# Patient Record
Sex: Female | Born: 1965 | Race: White | Marital: Single | Smoking: Never smoker
Health system: Southern US, Community
[De-identification: ages and names within clinical notes are randomized; demographics above are authoritative.]

## PROBLEM LIST (undated history)

## (undated) DIAGNOSIS — T7840XA Allergy, unspecified, initial encounter: Secondary | ICD-10-CM

## (undated) HISTORY — DX: Allergy, unspecified, initial encounter: T78.40XA

---

## 2015-11-08 ENCOUNTER — Other Ambulatory Visit: Payer: Self-pay | Admitting: Internal Medicine

## 2015-11-08 DIAGNOSIS — Z1231 Encounter for screening mammogram for malignant neoplasm of breast: Secondary | ICD-10-CM

## 2015-11-20 ENCOUNTER — Ambulatory Visit: Payer: Self-pay

## 2015-12-04 ENCOUNTER — Ambulatory Visit: Payer: Self-pay

## 2016-12-05 ENCOUNTER — Other Ambulatory Visit: Payer: Self-pay | Admitting: Internal Medicine

## 2016-12-05 DIAGNOSIS — Z1231 Encounter for screening mammogram for malignant neoplasm of breast: Secondary | ICD-10-CM

## 2016-12-09 ENCOUNTER — Ambulatory Visit: Payer: Self-pay

## 2017-01-06 ENCOUNTER — Encounter (HOSPITAL_COMMUNITY): Payer: Self-pay

## 2017-01-06 ENCOUNTER — Ambulatory Visit
Admission: RE | Admit: 2017-01-06 | Discharge: 2017-01-06 | Disposition: A | Discharge status: Home / Self Care | Payer: BLUE CROSS/BLUE SHIELD | Source: Ambulatory Visit | Attending: Internal Medicine | Admitting: Internal Medicine

## 2017-01-06 DIAGNOSIS — Z1231 Encounter for screening mammogram for malignant neoplasm of breast: Secondary | ICD-10-CM | POA: Diagnosis present

## 2017-11-17 ENCOUNTER — Other Ambulatory Visit: Payer: Self-pay | Admitting: Internal Medicine

## 2017-11-17 DIAGNOSIS — Z1231 Encounter for screening mammogram for malignant neoplasm of breast: Secondary | ICD-10-CM

## 2017-12-15 ENCOUNTER — Encounter (INDEPENDENT_AMBULATORY_CARE_PROVIDER_SITE_OTHER): Payer: Self-pay | Admitting: Vascular Surgery

## 2017-12-23 ENCOUNTER — Encounter (INDEPENDENT_AMBULATORY_CARE_PROVIDER_SITE_OTHER): Payer: Self-pay | Admitting: Vascular Surgery

## 2017-12-23 ENCOUNTER — Ambulatory Visit (INDEPENDENT_AMBULATORY_CARE_PROVIDER_SITE_OTHER): Payer: BLUE CROSS/BLUE SHIELD | Admitting: Vascular Surgery

## 2017-12-23 VITALS — BP 128/74 | HR 67 | Resp 14 | Ht <= 58 in | Wt 126.0 lb

## 2017-12-23 DIAGNOSIS — I83813 Varicose veins of bilateral lower extremities with pain: Secondary | ICD-10-CM | POA: Diagnosis not present

## 2017-12-23 NOTE — Progress Notes (Signed)
Subjective:    Patient ID: Monica SchaumannDimitra Phillipson, female    DOB: 08-02-1966, 52 y.o.   MRN: [id number] Chief Complaint  Patient presents with  . New Patient (Initial Visit)    Varicose Veins   Presents as a new patient referred by Dr. Graciela HusbandsKlein for evaluation of varicose veins.  The patient endorses a long-standing history of painful varicose veins located to the bilateral lower extremity.  The patient notes that she stands a lot for work.  The patient notes soreness along her varicosities especially towards the end of the day.  Her discomfort also increases with sitting and standing for long periods of time.  The patient denies any surgery or trauma to the bilateral lower extremity.  The patient denies any DVT history to the bilateral lower extremity.  The patient notes that her symptoms have progressed to the point that she is unable to function on a daily basis.  At this time, the patient does not engage in conservative therapy including wearing medical grade 1 compression stockings or elevating her legs.  The patient denies any fever, nausea vomiting.   Review of Systems  Constitutional: Negative.   HENT: Negative.   Eyes: Negative.   Respiratory: Negative.   Cardiovascular:       Painful varicose veins  Gastrointestinal: Negative.   Endocrine: Negative.   Genitourinary: Negative.   Musculoskeletal: Negative.   Skin: Negative.   Allergic/Immunologic: Negative.   Neurological: Negative.   Hematological: Negative.   Psychiatric/Behavioral: Negative.       Objective:   Physical Exam  Constitutional: She is oriented to person, place, and time. She appears well-developed. No distress.  HENT:  Head: Normocephalic and atraumatic.  Eyes: Conjunctivae are normal. Pupils are equal, round, and reactive to light.  Neck: Normal range of motion.  Cardiovascular: Normal rate, regular rhythm, normal heart sounds and intact distal pulses.  Pulses:      Radial pulses are 2+ on the right side, and  2+ on the left side.       Dorsalis pedis pulses are 2+ on the right side, and 2+ on the left side.       Posterior tibial pulses are 2+ on the right side, and 2+ on the left side.  Pulmonary/Chest: Effort normal and breath sounds normal.  Musculoskeletal: She exhibits no edema (No bilateral lower extremity edema noted).  Neurological: She is alert and oriented to person, place, and time.  Skin: She is not diaphoretic.  Less than 1 cm diffuse varicosities noted to the bilateral lower extremity.  There is no stasis dermatitis, skin changes, cellulitis or ulceration noted to the bilateral lower extremity.  Psychiatric: She has a normal mood and affect. Her behavior is normal. Judgment and thought content normal.  Vitals reviewed.  BP 128/74 (BP Location: Right Arm)   Pulse 67   Resp 14   Ht 4\' 10"  (1.473 m)   Wt 126 lb (57.2 kg)   BMI 26.33 kg/m   Past Medical History:  Diagnosis Date  . Allergy    Social History   Socioeconomic History  . Marital status: Single    Spouse name: Not on file  . Number of children: Not on file  . Years of education: Not on file  . Highest education level: Not on file  Social Needs  . Financial resource strain: Not on file  . Food insecurity - worry: Not on file  . Food insecurity - inability: Not on file  . Transportation needs - medical:  Not on file  . Transportation needs - non-medical: Not on file  Occupational History  . Not on file  Tobacco Use  . Smoking status: Never Smoker  . Smokeless tobacco: Never Used  Substance and Sexual Activity  . Alcohol use: No    Frequency: Never  . Drug use: Not on file  . Sexual activity: Not on file  Other Topics Concern  . Not on file  Social History Narrative  . Not on file   No past surgical history on file.  Family History  Problem Relation Age of Onset  . Varicose Veins Mother   . Varicose Veins Cousin   . Breast cancer Neg Hx    Allergies  Allergen Reactions  . Amoxicillin      Other reaction(s): Unknown      Assessment & Plan:  Presents as a new patient referred by Dr. Graciela Husbands for evaluation of varicose veins.  The patient endorses a long-standing history of painful varicose veins located to the bilateral lower extremity.  The patient notes that she stands a lot for work.  The patient notes soreness along her varicosities especially towards the end of the day.  Her discomfort also increases with sitting and standing for long periods of time.  The patient denies any surgery or trauma to the bilateral lower extremity.  The patient denies any DVT history to the bilateral lower extremity.  The patient notes that her symptoms have progressed to the point that she is unable to function on a daily basis.  At this time, the patient does not engage in conservative therapy including wearing medical grade 1 compression stockings or elevating her legs.  The patient denies any fever, nausea vomiting.  1. Varicose veins of both lower extremities with pain - New The patient was encouraged to wear graduated compression stockings (20-30 mmHg) on a daily basis. The patient was instructed to begin wearing the stockings first thing in the morning and removing them in the evening. The patient was instructed specifically not to sleep in the stockings. Prescription given.  In addition, behavioral modification including elevation during the day will be initiated. Anti-inflammatories for pain. I will bring the patient back to undergo bilateral venous duplex to rule out any contributing reflux disease The patient will follow up in three months to asses conservative management.  Information on chronic venous insufficiency and compression stockings was given to the patient. The patient was instructed to call the office in the interim if any worsening edema or ulcerations to the legs, feet or toes occurs. The patient expresses their understanding.  - VAS [place] LOWER EXTREMITY VENOUS REFLUX;  Future  Current Outpatient Medications on File Prior to Visit  Medication Sig Dispense Refill  . fexofenadine (ALLEGRA) 180 MG tablet Take by mouth.    Marland Kitchen ibuprofen (ADVIL,MOTRIN) 200 MG tablet      No current facility-administered medications on file prior to visit.    There are no Patient Instructions on file for this visit. No Follow-up on file.  Brantly Kalman A Sebastian Dzik, PA-C

## 2018-01-07 ENCOUNTER — Ambulatory Visit
Admission: RE | Admit: 2018-01-07 | Discharge: 2018-01-07 | Disposition: A | Discharge status: Home / Self Care | Payer: BLUE CROSS/BLUE SHIELD | Source: Ambulatory Visit | Attending: Internal Medicine | Admitting: Internal Medicine

## 2018-01-07 ENCOUNTER — Other Ambulatory Visit: Payer: Self-pay | Admitting: Internal Medicine

## 2018-01-07 DIAGNOSIS — Z1231 Encounter for screening mammogram for malignant neoplasm of breast: Secondary | ICD-10-CM

## 2018-03-30 ENCOUNTER — Ambulatory Visit (INDEPENDENT_AMBULATORY_CARE_PROVIDER_SITE_OTHER): Payer: BLUE CROSS/BLUE SHIELD | Admitting: Vascular Surgery

## 2018-03-30 ENCOUNTER — Encounter (INDEPENDENT_AMBULATORY_CARE_PROVIDER_SITE_OTHER): Payer: BLUE CROSS/BLUE SHIELD

## 2018-07-20 ENCOUNTER — Encounter

## 2018-07-20 ENCOUNTER — Ambulatory Visit (INDEPENDENT_AMBULATORY_CARE_PROVIDER_SITE_OTHER): Payer: BLUE CROSS/BLUE SHIELD | Admitting: Vascular Surgery

## 2018-07-20 ENCOUNTER — Encounter (INDEPENDENT_AMBULATORY_CARE_PROVIDER_SITE_OTHER): Payer: BLUE CROSS/BLUE SHIELD

## 2018-08-10 ENCOUNTER — Encounter (INDEPENDENT_AMBULATORY_CARE_PROVIDER_SITE_OTHER): Payer: BLUE CROSS/BLUE SHIELD

## 2018-08-10 ENCOUNTER — Ambulatory Visit (INDEPENDENT_AMBULATORY_CARE_PROVIDER_SITE_OTHER): Payer: BLUE CROSS/BLUE SHIELD | Admitting: Vascular Surgery

## 2018-08-11 ENCOUNTER — Encounter (INDEPENDENT_AMBULATORY_CARE_PROVIDER_SITE_OTHER): Payer: Self-pay | Admitting: Nurse Practitioner

## 2018-08-11 ENCOUNTER — Ambulatory Visit (INDEPENDENT_AMBULATORY_CARE_PROVIDER_SITE_OTHER): Payer: BLUE CROSS/BLUE SHIELD | Admitting: Nurse Practitioner

## 2018-08-11 ENCOUNTER — Ambulatory Visit (INDEPENDENT_AMBULATORY_CARE_PROVIDER_SITE_OTHER): Payer: BLUE CROSS/BLUE SHIELD

## 2018-08-11 VITALS — BP 124/71 | HR 68 | Resp 15 | Ht <= 58 in | Wt 121.0 lb

## 2018-08-11 DIAGNOSIS — I83813 Varicose veins of bilateral lower extremities with pain: Secondary | ICD-10-CM | POA: Diagnosis not present

## 2018-08-11 DIAGNOSIS — I872 Venous insufficiency (chronic) (peripheral): Secondary | ICD-10-CM

## 2018-08-11 DIAGNOSIS — L508 Other urticaria: Secondary | ICD-10-CM | POA: Insufficient documentation

## 2018-08-11 NOTE — Progress Notes (Signed)
Subjective:    Patient ID: Monica Weaver, female    DOB: [birth year], 52 y.o.   MRN: [id number] Chief Complaint  Patient presents with  . Follow-up    ultrasound follow up    HPI  Monica Weaver is a 52 y.o. female that is following up for lower extremity swelling and bilateral varicosities.  She states that she has spider varicosities bilaterally and states that they are increasing which is been a concern for her.  Since her last visit, she has been wearing medical grade 1 compression stockings bilaterally.  She states that they have helped her with the heaviness feeling in her bilateral lower extremities.  She states that she has had less swelling since she has begun to wear them.  There is been no change in her spider varicosities.  She has no prior surgical history.  Her only other medical history is periodic urticaria.  She denies any rest pain or claudication-like symptoms.  She denies any chest pain or shortness of breath.  She denies any fever, chills, nausea, vomiting.  She underwent a bilateral lower extremity venous reflux study today which revealed no evidence of DVT or superficial venous thrombosis.  She has reflux greater than 500 ms in her left common femoral vein.  Past Medical History:  Diagnosis Date  . Allergy     History reviewed. No pertinent surgical history.  Social History   Socioeconomic History  . Marital status: Single    Spouse name: Not on file  . Number of children: Not on file  . Years of education: Not on file  . Highest education level: Not on file  Occupational History  . Not on file  Social Needs  . Financial resource strain: Not on file  . Food insecurity:    Worry: Not on file    Inability: Not on file  . Transportation needs:    Medical: Not on file    Non-medical: Not on file  Tobacco Use  . Smoking status: Never Smoker  . Smokeless tobacco: Never Used  Substance and Sexual Activity  . Alcohol use: No    Frequency: Never    . Drug use: Not on file  . Sexual activity: Not on file  Lifestyle  . Physical activity:    Days per week: Not on file    Minutes per session: Not on file  . Stress: Not on file  Relationships  . Social connections:    Talks on phone: Not on file    Gets together: Not on file    Attends religious service: Not on file    Active member of club or organization: Not on file    Attends meetings of clubs or organizations: Not on file    Relationship status: Not on file  . Intimate partner violence:    Fear of current or ex partner: Not on file    Emotionally abused: Not on file    Physically abused: Not on file    Forced sexual activity: Not on file  Other Topics Concern  . Not on file  Social History Narrative  . Not on file    Family History  Problem Relation Age of Onset  . Varicose Veins Mother   . Varicose Veins Cousin   . Breast cancer Neg Hx     Allergies  Allergen Reactions  . Amoxicillin     Other reaction(s): Unknown     Review of Systems   Review of Systems: Negative Unless Checked Constitutional: [] Weight  loss  [] Fever  [] Chills Cardiac: [] Chest pain   []  Atrial Fibrillation  [] Palpitations   [] Shortness of breath when laying flat   [] Shortness of breath with exertion. Vascular:  [] Pain in legs with walking   [] Pain in legs with standing  [] History of DVT   [] Phlebitis   [x] Swelling in legs   [x] Varicose veins   [] Non-healing ulcers Pulmonary:   [] Uses home oxygen   [] Productive cough   [] Hemoptysis   [] Wheeze  [] COPD   [] Asthma Neurologic:  [] Dizziness   [] Seizures   [] History of stroke   [] History of TIA  [] Aphasia   [] Vissual changes   [] Weakness or numbness in arm   [] Weakness or numbness in leg Musculoskeletal:   [] Joint swelling   [] Joint pain   [] Low back pain  []  History of Knee Replacement Hematologic:  [] Easy bruising  [] Easy bleeding   [] Hypercoagulable state   [] Anemic Gastrointestinal:  [] Diarrhea   [] Vomiting  [] Gastroesophageal reflux/heartburn    [] Difficulty swallowing. Genitourinary:  [] Chronic kidney disease   [] Difficult urination  [] Anuric   [] Blood in urine Skin:  [] Rashes   [] Ulcers  Psychological:  [] History of anxiety   []  History of major depression  []  Memory Difficulties     Objective:   Physical Exam  BP 124/71 (BP Location: Right Arm)   Pulse 68   Resp 15   Ht 4\' 10"  (1.473 m)   Wt 121 lb (54.9 kg)   BMI 25.29 kg/m   Gen: WD/WN, NAD Head: [place]/AT, No temporalis wasting.  Ear/Nose/Throat: Hearing grossly intact, nares w/o erythema or drainage Eyes: PER, EOMI, sclera nonicteric.  Neck: Supple, no masses.  No JVD.  Pulmonary:  Good air movement, no use of accessory muscles.  Cardiac: RRR Vascular: bilateral scattered spider varicosities Vessel Right Left  Radial Palpable Palpable  Dorsalis Pedis Palpable Palpable  Posterior Tibial Palpable Palpable   Gastrointestinal: soft, non-distended. No guarding/no peritoneal signs.  Musculoskeletal: M/S 5/5 throughout.  No deformity or atrophy.  Neurologic: Pain and light touch intact in extremities.  Symmetrical.  Speech is fluent. Motor exam as listed above. Psychiatric: Judgment intact, Mood & affect appropriate for pt's clinical situation. Dermatologic: No Venous rashes. No Ulcers Noted.  No changes consistent with cellulitis. Lymph : No Cervical lymphadenopathy, no lichenification or skin changes of chronic lymphedema.      Assessment & Plan:   1. Chronic venous insufficiency She underwent a bilateral lower extremity venous reflux study today which revealed no evidence of DVT or superficial venous thrombosis.  She has reflux greater than 500 ms in her left common femoral vein.  Recommend:  The patient is complaining of varicose veins.    I have had a long discussion with the patient regarding  varicose veins and why they cause symptoms.  Patient will begin wearing graduated compression stockings on a daily basis, beginning first thing in the morning and  removing them in the evening. The patient is instructed specifically not to sleep in the stockings.    The patient  will also begin using over-the-counter analgesics such as Motrin 600 mg po TID to help control the symptoms as needed.    In addition, behavioral modification including elevation during the day will be initiated, utilizing a recliner was recommended.  The patient is also instructed to continue exercising such as walking 4-5 times per week.  At this time the patient wishes to continue conservative therapy and is not interested in more invasive treatments such as laser ablation and sclerotherapy.  The  Patient will follow up PRN if the symptoms worsen.  2. Recurrent periodic urticaria Well controlled with allegra. Will continue to use.    Current Outpatient Medications on File Prior to Visit  Medication Sig Dispense Refill  . fexofenadine (ALLEGRA) 180 MG tablet Take by mouth.    Marland Kitchen ibuprofen (ADVIL,MOTRIN) 200 MG tablet      No current facility-administered medications on file prior to visit.     There are no Patient Instructions on file for this visit. Return if symptoms worsen or fail to improve.   Georgiana Spinner, NP  This note was completed with Office manager.  Any errors are purely unintentional.

## 2019-03-17 ENCOUNTER — Other Ambulatory Visit: Payer: Self-pay | Admitting: Obstetrics and Gynecology

## 2019-03-17 DIAGNOSIS — Z1231 Encounter for screening mammogram for malignant neoplasm of breast: Secondary | ICD-10-CM

## 2019-05-24 ENCOUNTER — Other Ambulatory Visit: Payer: Self-pay

## 2019-05-24 ENCOUNTER — Ambulatory Visit
Admission: RE | Admit: 2019-05-24 | Discharge: 2019-05-24 | Disposition: A | Discharge status: Home / Self Care | Payer: BLUE CROSS/BLUE SHIELD | Source: Ambulatory Visit | Attending: Obstetrics and Gynecology | Admitting: Obstetrics and Gynecology

## 2019-05-24 DIAGNOSIS — Z1231 Encounter for screening mammogram for malignant neoplasm of breast: Secondary | ICD-10-CM

## 2020-03-21 ENCOUNTER — Other Ambulatory Visit: Payer: Self-pay | Admitting: Obstetrics and Gynecology

## 2020-03-21 DIAGNOSIS — Z1231 Encounter for screening mammogram for malignant neoplasm of breast: Secondary | ICD-10-CM

## 2020-12-30 IMAGING — MG DIGITAL SCREENING BILATERAL MAMMOGRAM WITH TOMO AND CAD
6 of 10 series · 6 of 30 positions shown · non-contrast
Comparison: Previous exam(s).

CLINICAL DATA: Screening.

EXAM:
DIGITAL SCREENING BILATERAL MAMMOGRAM WITH TOMO AND CAD

[R CC synth-2D]
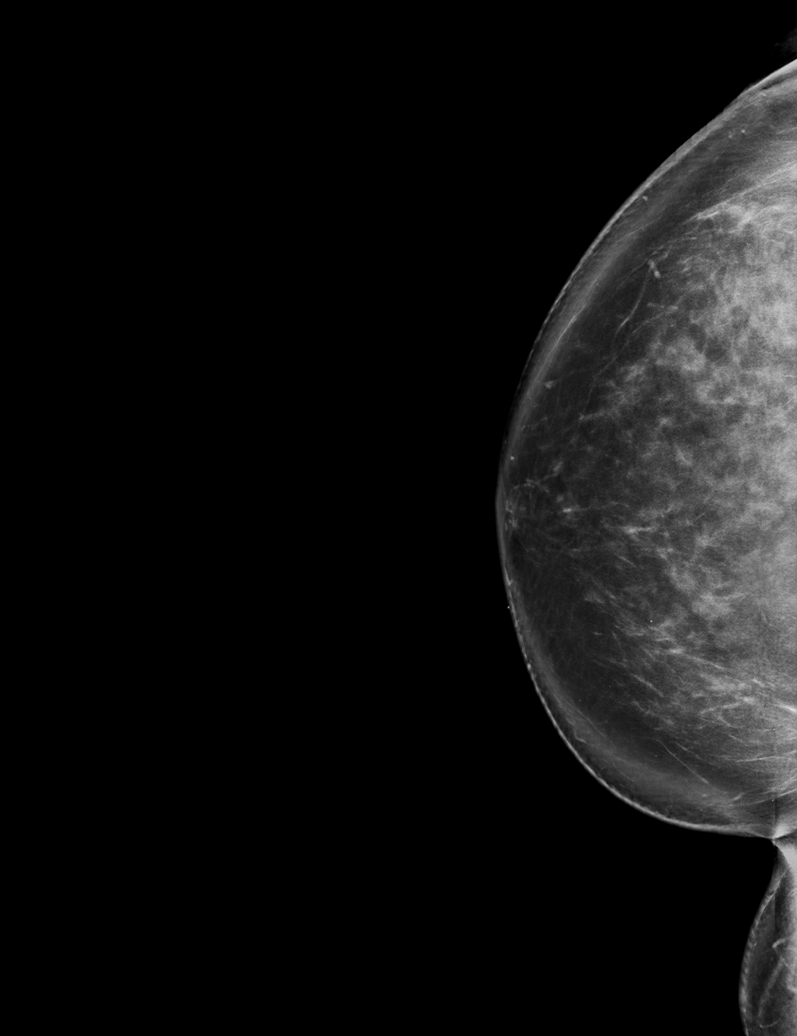

[L MLO synth-2D (1 of 2)]
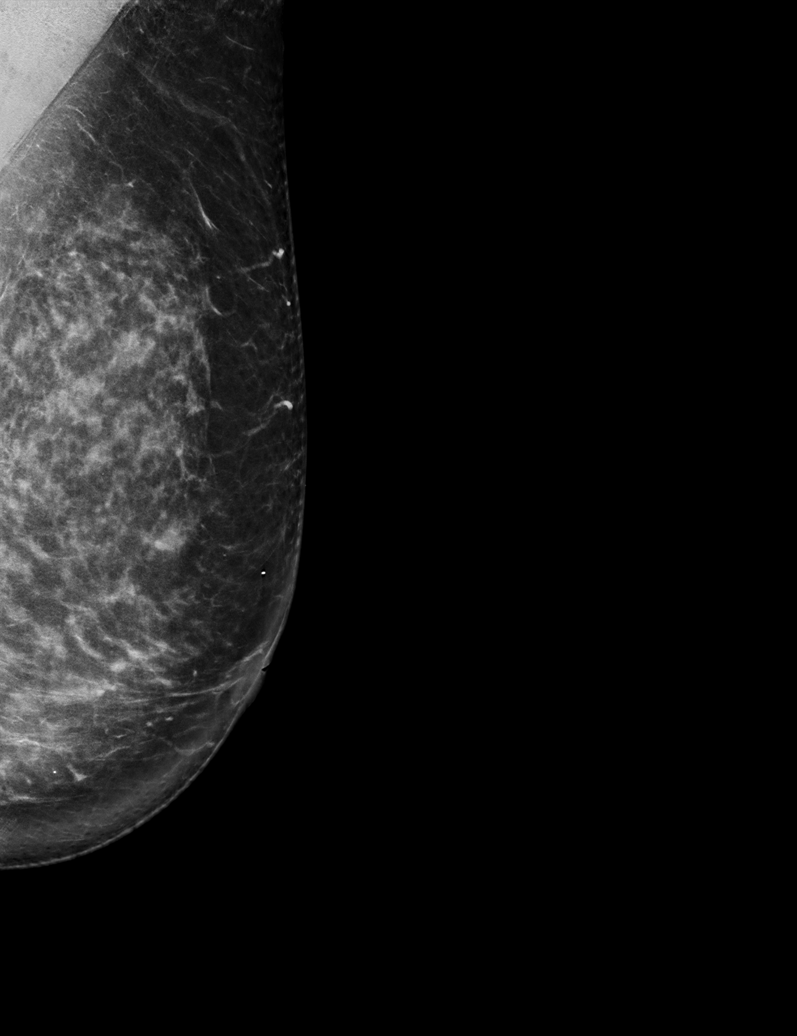

[R MLO synth-2D]
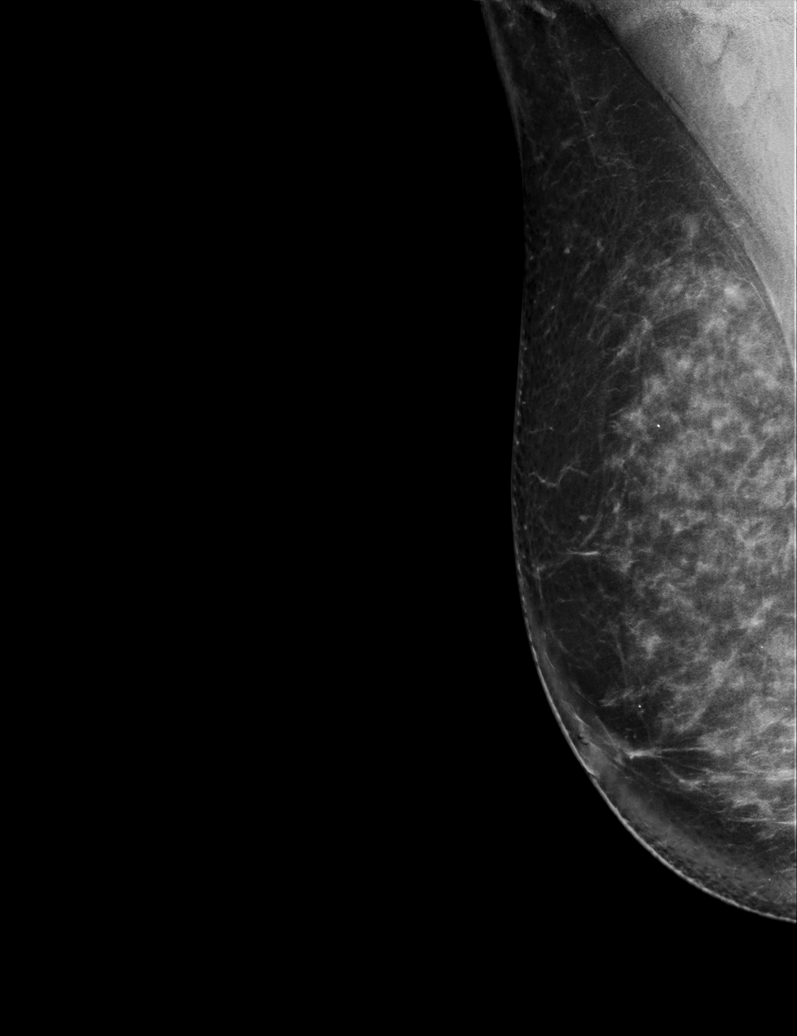

[L CC synth-2D]
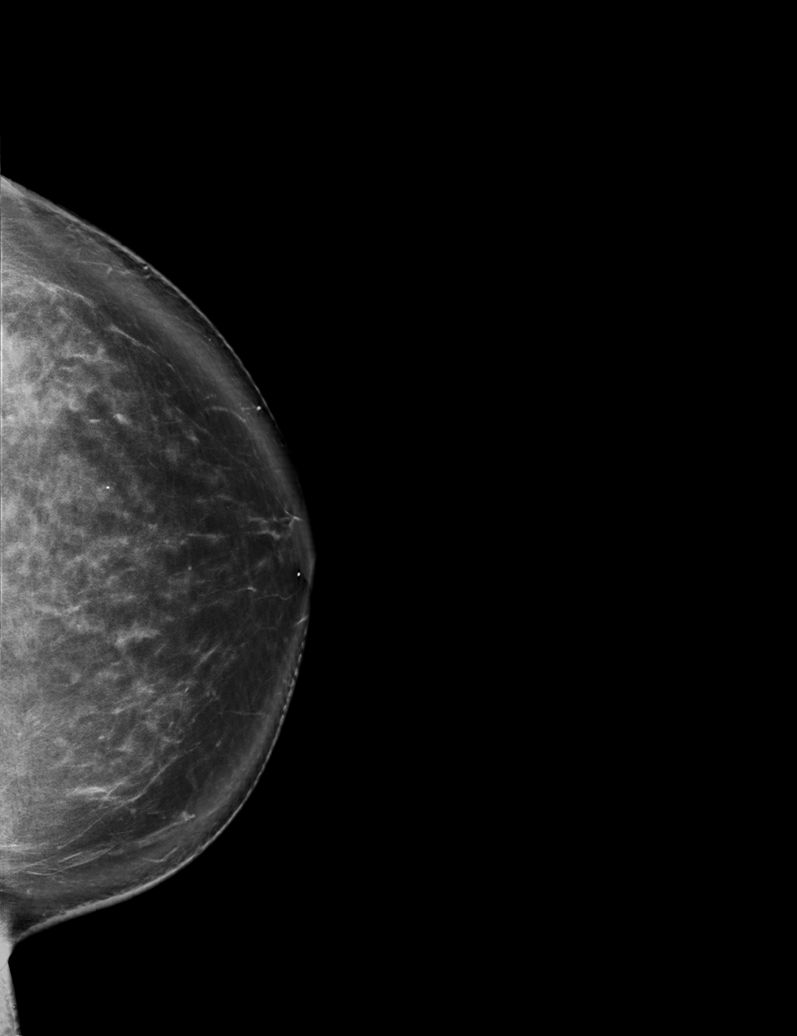

[L MLO synth-2D (2 of 2)]
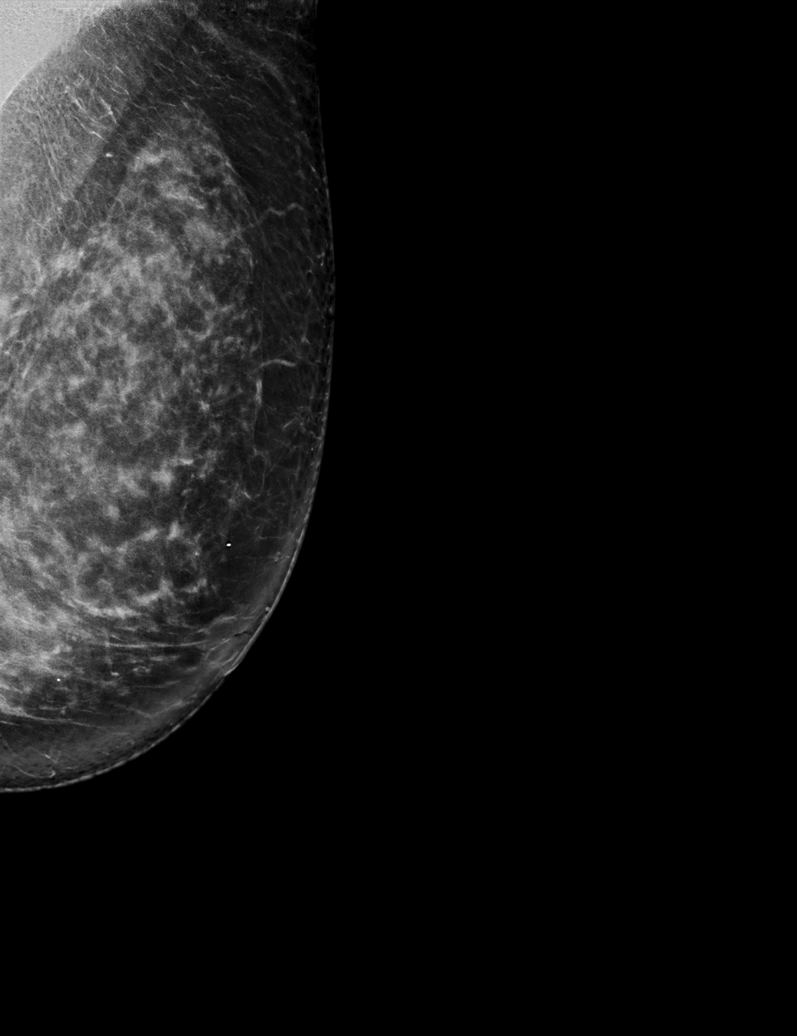

[R MLO tomo · tomo slice 47/93.0]
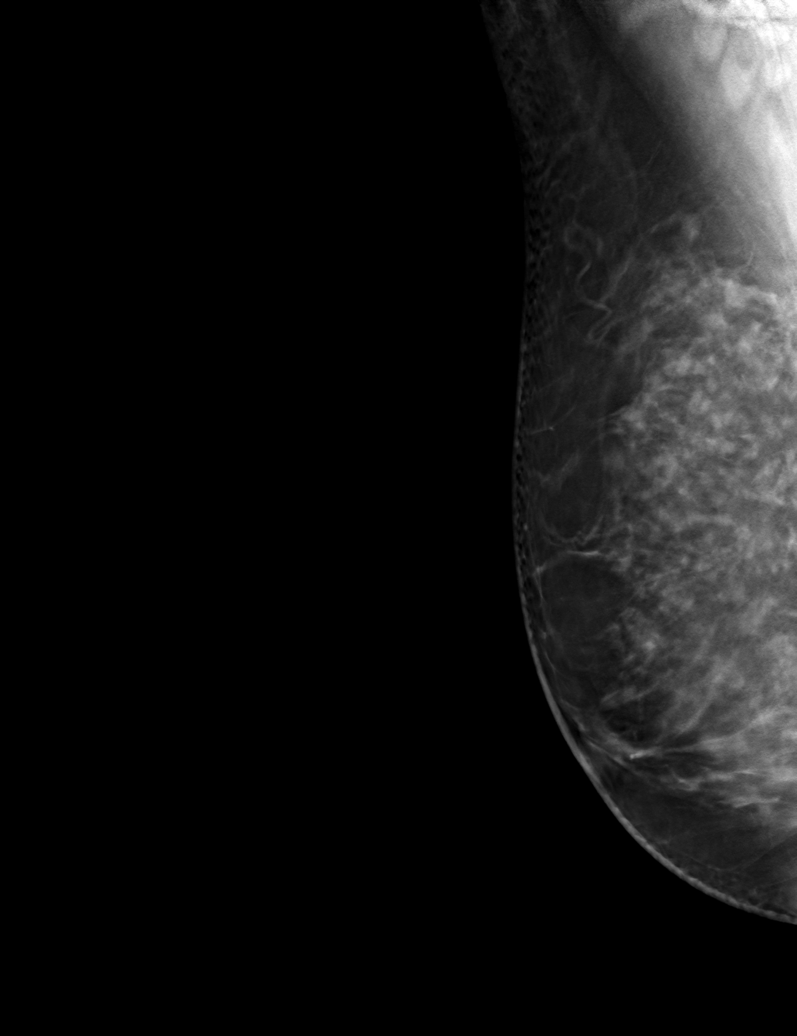

[6 of 30 positions shown; findings below may reference images not displayed]

ACR Breast Density Category c: The breast tissue is heterogeneously
dense, which may obscure small masses.
FINDINGS: There are no findings suspicious for malignancy. Images were
processed with CAD.
IMPRESSION: No mammographic evidence of malignancy. A result letter of this
screening mammogram will be mailed directly to the patient.

RECOMMENDATION:
Screening mammogram in one year. (Code:FT-U-LHB)

BI-RADS CATEGORY  1: Negative.

## 2022-04-01 DIAGNOSIS — E785 Hyperlipidemia, unspecified: Secondary | ICD-10-CM | POA: Diagnosis not present

## 2022-04-01 DIAGNOSIS — Z0000 Encounter for general adult medical examination without abnormal findings: Secondary | ICD-10-CM | POA: Diagnosis not present

## 2022-04-08 DIAGNOSIS — Z1211 Encounter for screening for malignant neoplasm of colon: Secondary | ICD-10-CM | POA: Diagnosis not present

## 2022-04-08 DIAGNOSIS — L508 Other urticaria: Secondary | ICD-10-CM | POA: Diagnosis not present

## 2022-04-08 DIAGNOSIS — I872 Venous insufficiency (chronic) (peripheral): Secondary | ICD-10-CM | POA: Diagnosis not present

## 2022-04-08 DIAGNOSIS — Z0000 Encounter for general adult medical examination without abnormal findings: Secondary | ICD-10-CM | POA: Diagnosis not present

## 2022-07-16 ENCOUNTER — Other Ambulatory Visit: Payer: Self-pay | Admitting: Internal Medicine

## 2022-07-16 DIAGNOSIS — Z1231 Encounter for screening mammogram for malignant neoplasm of breast: Secondary | ICD-10-CM

## 2022-08-19 ENCOUNTER — Ambulatory Visit
Admission: RE | Admit: 2022-08-19 | Discharge: 2022-08-19 | Disposition: A | Discharge status: Home / Self Care | Payer: 59 | Source: Ambulatory Visit | Attending: Internal Medicine | Admitting: Internal Medicine

## 2022-08-19 DIAGNOSIS — Z1231 Encounter for screening mammogram for malignant neoplasm of breast: Secondary | ICD-10-CM | POA: Diagnosis present

## 2023-05-29 ENCOUNTER — Encounter: Payer: Self-pay | Admitting: Dermatology

## 2023-05-29 ENCOUNTER — Ambulatory Visit: Payer: 59 | Admitting: Dermatology

## 2023-05-29 VITALS — BP 138/79 | HR 79

## 2023-05-29 DIAGNOSIS — L578 Other skin changes due to chronic exposure to nonionizing radiation: Secondary | ICD-10-CM

## 2023-05-29 DIAGNOSIS — L219 Seborrheic dermatitis, unspecified: Secondary | ICD-10-CM | POA: Diagnosis not present

## 2023-05-29 DIAGNOSIS — L738 Other specified follicular disorders: Secondary | ICD-10-CM

## 2023-05-29 DIAGNOSIS — L820 Inflamed seborrheic keratosis: Secondary | ICD-10-CM

## 2023-05-29 NOTE — Progress Notes (Signed)
   New pt Visit   Subjective  Monica Weaver is a 57 y.o. female who presents for the following: spot at nose that notice for a few years will not go away  Spot at right cheek   The patient has spots, moles and lesions to be evaluated, some may be new or changing and the patient may have concern these could be cancer.   The following portions of the chart were reviewed this encounter and updated as appropriate: medications, allergies, medical history  Review of Systems:  No other skin or systemic complaints except as noted in HPI or Assessment and Plan.  Objective  Well appearing patient in no apparent distress; mood and affect are within normal limits.   A focused examination was performed of the following areas: Face, nose  Relevant exam findings are noted in the Assessment and Plan.         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 Call for changes.  Retin a to shrink gland Discussed Accutane   SEBORRHEIC KERATOSIS - Stuck-on, waxy, tan-brown papules and/or plaques  - Benign-appearing - Discussed benign etiology and prognosis. - Observe - Call for any change  Inflamed seborrheic keratosis right cheek x 1  Symptomatic, irritating, patient would like treated.  Destruction of lesion - right cheek x 1 Complexity: simple   Destruction method: cryotherapy   Informed consent: discussed and consent obtained   Timeout:  patient name, date of birth, surgical site, and procedure verified Lesion destroyed using liquid nitrogen: Yes   Region frozen until ice ball extended beyond lesion: Yes   Outcome: patient tolerated procedure well with no complications   Post-procedure details: wound care instructions given    ACTINIC DAMAGE - chronic, secondary to cumulative UV radiation  exposure/sun exposure over time - diffuse scaly erythematous macules with underlying dyspigmentation - Recommend daily broad spectrum sunscreen SPF 30+ to sun-exposed areas, reapply every 2 hours as needed.  - Recommend staying in the shade or wearing long sleeves, sun glasses (UVA+UVB protection) and wide brim hats (4-inch brim around the entire circumference of the hat). - Call for new or changing lesions.   Return if symptoms worsen or fail to improve.  IAsher Muir, CMA, am acting as scribe for Armida Sans, MD.   Documentation: I have reviewed the above documentation for accuracy and completeness, and I agree with the above.  Armida Sans, MD

## 2023-05-29 NOTE — Patient Instructions (Addendum)

## 2023-06-06 ENCOUNTER — Encounter: Payer: Self-pay | Admitting: Dermatology

## 2023-07-07 ENCOUNTER — Other Ambulatory Visit: Payer: Self-pay | Admitting: Obstetrics and Gynecology

## 2023-07-07 DIAGNOSIS — Z1231 Encounter for screening mammogram for malignant neoplasm of breast: Secondary | ICD-10-CM

## 2024-01-13 ENCOUNTER — Ambulatory Visit (INDEPENDENT_AMBULATORY_CARE_PROVIDER_SITE_OTHER): Admitting: Dermatology

## 2024-01-13 ENCOUNTER — Other Ambulatory Visit: Payer: Self-pay | Admitting: Dermatology

## 2024-01-13 DIAGNOSIS — D492 Neoplasm of unspecified behavior of bone, soft tissue, and skin: Secondary | ICD-10-CM

## 2024-01-13 DIAGNOSIS — D2371 Other benign neoplasm of skin of right lower limb, including hip: Secondary | ICD-10-CM | POA: Diagnosis not present

## 2024-01-13 NOTE — Progress Notes (Signed)
   Follow-Up Visit   Subjective  Monica Weaver is a 58 y.o. female who presents for the following: Patient c/o a growth on the back of her right leg x 1 week growing and changing patient concerned it may be a tick.  The patient has spots, moles and lesions to be evaluated, some may be new or changing and the patient may have concern these could be cancer.   The following portions of the chart were reviewed this encounter and updated as appropriate: medications, allergies, medical history  Review of Systems:  No other skin or systemic complaints except as noted in HPI or Assessment and Plan.  Objective  Well appearing patient in no apparent distress; mood and affect are within normal limits.   A focused examination was performed of the following areas: right leg    Relevant exam findings are noted in the Assessment and Plan.  Right Popliteal Fossa 3.0 mm black crusted papule   Assessment & Plan   NEOPLASM OF SKIN Right Popliteal Fossa Epidermal / dermal shaving  Lesion diameter (cm):  0.3 Informed consent: discussed and consent obtained   Timeout: patient name, date of birth, surgical site, and procedure verified   Procedure prep:  Patient was prepped and draped in usual sterile fashion Prep type:  Isopropyl alcohol Anesthesia: the lesion was anesthetized in a standard fashion   Anesthetic:  1% lidocaine w/ epinephrine 1-100,000 buffered w/ 8.4% NaHCO3 Instrument used: flexible razor blade   Hemostasis achieved with: pressure, aluminum chloride and electrodesiccation   Outcome: patient tolerated procedure well   Post-procedure details: wound care instructions given   Additional details:  Mupirocin ointment and Bandaid applied  Specimen 1 - Surgical pathology Differential Diagnosis: ISK vs Thrombosed hemangioma vs other  Check Margins: yes 3.0 mm black crusty papule  Return if symptoms worsen or fail to improve.  I, Angelique Holm, CMA, am acting as scribe for Willeen Niece, MD .   Documentation: I have reviewed the above documentation for accuracy and completeness, and I agree with the above.  Willeen Niece, MD

## 2024-01-13 NOTE — Patient Instructions (Addendum)
 Due to recent changes in healthcare laws, you may see results of your pathology and/or laboratory studies on MyChart before the doctors have had a chance to review them. We understand that in some cases there may be results that are confusing or concerning to you. Please understand that not all results are received at the same time and often the doctors may need to interpret multiple results in order to provide you with the best plan of care or course of treatment. Therefore, we ask that you please give [place] 2 business days to thoroughly review all your results before contacting the office for clarification. Should we see a critical lab result, you will be contacted sooner.   If You Need Anything After Your Visit  If you have any questions or concerns for your doctor, please call our main line at [phone] and press option 4 to reach your doctor's medical assistant. If no one answers, please leave a voicemail as directed and we will return your call as soon as possible. Messages left after 4 pm will be answered the following business day.   You may also send [place] a message via MyChart. We typically respond to MyChart messages within 1-2 business days.  For prescription refills, please ask your pharmacy to contact our office. Our fax number is [phone].  If you have an urgent issue when the clinic is closed that cannot wait until the next business day, you can page your doctor at the number below.    Please note that while we do our best to be available for urgent issues outside of office hours, we are not available 24/7.   If you have an urgent issue and are unable to reach [place], you may choose to seek medical care at your doctor's office, retail clinic, urgent care center, or emergency room.  If you have a medical emergency, please immediately call 911 or go to the emergency department.  Pager Numbers  - Dr. Gwen Pounds: [phone]  - Dr. Roseanne Reno: [phone]  - Dr. Katrinka Blazing: [phone]    In the event of inclement weather, please call our main line at [phone] for an update on the status of any delays or closures.  Dermatology Medication Tips: Please keep the boxes that topical medications come in in order to help keep track of the instructions about where and how to use these. Pharmacies typically print the medication instructions only on the boxes and not directly on the medication tubes.   If your medication is too expensive, please contact our office at [phone] option 4 or send [place] a message through MyChart.   We are unable to tell what your co-pay for medications will be in advance as this is different depending on your insurance coverage. However, we may be able to find a substitute medication at lower cost or fill out paperwork to get insurance to cover a needed medication.   If a prior authorization is required to get your medication covered by your insurance company, please allow [place] 1-2 business days to complete this process.  Drug prices often vary depending on where the prescription is filled and some pharmacies may offer cheaper prices.  The website www.goodrx.com contains coupons for medications through different pharmacies. The prices here do not account for what the cost may be with help from insurance (it may be cheaper with your insurance), but the website can give you the price if you did not use any insurance.  - You can print the associated coupon and take it  with your prescription to the pharmacy.  - You may also stop by our office during regular business hours and pick up a GoodRx coupon card.  - If you need your prescription sent electronically to a different pharmacy, notify our office through [facility] or by phone at [phone] option 4.     Si Usted Necesita Algo Despus de Su Visita  Tambin puede enviarnos un mensaje a travs de Clinical cytogeneticist. Por lo general respondemos a los mensajes de MyChart en el transcurso de 1 a 2 das  hbiles.  Para renovar recetas, por favor pida a su farmacia que se ponga en contacto con nuestra oficina. Annie Sable de fax es [place] [phone].  Si tiene un asunto urgente cuando la clnica est cerrada y que no puede esperar hasta el siguiente da hbil, puede llamar/localizar a su doctor(a) al nmero que aparece a continuacin.   Por favor, tenga en cuenta que aunque hacemos todo lo posible para estar disponibles para asuntos urgentes fuera del horario de [place], no estamos disponibles las 24 horas del da, los 7 [address]  [address] de la [employer].   Si tiene un problema urgente y no puede comunicarse con nosotros, puede optar por buscar atencin mdica  en el consultorio de su doctor(a), en una clnica privada, en un centro de atencin urgente o en una sala de emergencias.  Si tiene Engineer, drilling, por favor llame inmediatamente al 911 o vaya a la sala de emergencias.  Nmeros de bper  - Dr. Gwen Pounds: [phone]  - Dra. Roseanne Reno: [phone]  - Dr. Katrinka Blazing: [phone]   En caso de inclemencias del tiempo, por favor llame a Lacy Duverney principal al [phone] para una actualizacin sobre el [place] de cualquier retraso o cierre.  Consejos para la medicacin en dermatologa: Por favor, guarde las cajas en las que vienen los medicamentos de uso tpico para ayudarle a seguir las instrucciones sobre dnde y cmo usarlos. Las farmacias generalmente imprimen las instrucciones del medicamento slo en las cajas y no directamente en los tubos del [place].   Si su medicamento es muy caro, por favor, pngase en contacto con Rolm Gala llamando al [phone] y presione la opcin 4 o envenos un mensaje a travs de Clinical cytogeneticist.   No podemos decirle cul ser su copago por los medicamentos por adelantado ya que esto es diferente dependiendo de la cobertura de su seguro. Sin embargo, es posible que podamos encontrar un medicamento sustituto a Audiological scientist un formulario para que el  seguro cubra el medicamento que se considera necesario.   Si se requiere una autorizacin previa para que su compaa de seguros [place] su medicamento, por favor permtanos de 1 a 2 das hbiles para completar [address]  Los precios de los medicamentos varan con frecuencia dependiendo del Environmental consultant de dnde se surte la receta y alguna farmacias pueden ofrecer precios ms baratos.  El sitio web www.goodrx.com tiene cupones para medicamentos de Health and safety inspector. Los precios aqu no tienen en cuenta lo que podra costar con la ayuda del seguro (puede ser ms barato con su seguro), pero el sitio web puede darle el precio si no utiliz Tourist information centre manager.  - Puede imprimir el cupn correspondiente y llevarlo con su receta a la farmacia.  - Tambin puede pasar por nuestra oficina durante el horario de atencin regular y Education officer, museum una tarjeta de cupones de GoodRx.  - Si necesita que su receta se enve electrnicamente a Psychiatrist, informe a nuestra oficina a travs de MyChart de  [place] o por telfono llamando al [phone] y presione la opcin 4.       Wound Care Instructions  Cleanse wound gently with soap and water once a day then pat dry with clean gauze. Apply a thin coat of Petrolatum (petroleum jelly, "Vaseline") over the wound (unless you have an allergy to this). We recommend that you use a new, sterile tube of Vaseline. Do not pick or remove scabs. Do not remove the yellow or white "healing tissue" from the base of the wound.  Cover the wound with fresh, clean, nonstick gauze and secure with paper tape. You may use Band-Aids in place of gauze and tape if the wound is small enough, but would recommend trimming much of the tape off as there is often too much. Sometimes Band-Aids can irritate the skin.  You should call the office for your biopsy report after 1 week if you have not already been contacted.  If you experience any problems, such as abnormal amounts of bleeding,  swelling, significant bruising, significant pain, or evidence of infection, please call the office immediately.  FOR ADULT SURGERY PATIENTS: If you need something for pain relief you may take 1 extra strength Tylenol (acetaminophen) AND 2 Ibuprofen (200mg  each) together every 4 hours as needed for pain. (do not take these if you are allergic to them or if you have a reason you should not take them.) Typically, you may only need pain medication for 1 to 3 days.

## 2024-01-16 LAB — DERMATOLOGY PATHOLOGY

## 2024-01-19 ENCOUNTER — Telehealth: Payer: Self-pay

## 2024-01-19 NOTE — Telephone Encounter (Signed)
 Left pt msg to call for bx results/sh

## 2024-01-19 NOTE — Telephone Encounter (Signed)
Patient advised of BX result. aw

## 2024-01-19 NOTE — Telephone Encounter (Signed)
-----   Message from Artemio Larry sent at 01/19/2024  1:06 PM EDT ----- 1. Skin, right popliteal fossa :       ANGIOKERATOMA   Benign skin growth containing blood vessels, no further treatment needed - please call patient

## 2024-01-20 ENCOUNTER — Ambulatory Visit: Admitting: Dermatology

## 2024-05-10 ENCOUNTER — Ambulatory Visit
Admission: RE | Admit: 2024-05-10 | Discharge: 2024-05-10 | Disposition: A | Discharge status: Home / Self Care | Source: Ambulatory Visit | Attending: Obstetrics and Gynecology | Admitting: Obstetrics and Gynecology

## 2024-05-10 ENCOUNTER — Encounter

## 2024-05-10 DIAGNOSIS — Z1231 Encounter for screening mammogram for malignant neoplasm of breast: Secondary | ICD-10-CM | POA: Diagnosis present

## 2024-05-17 ENCOUNTER — Ambulatory Visit
Admission: RE | Admit: 2024-05-17 | Discharge: 2024-05-17 | Disposition: A | Discharge status: Home / Self Care | Source: Ambulatory Visit | Attending: Obstetrics and Gynecology | Admitting: Obstetrics and Gynecology

## 2024-05-17 ENCOUNTER — Other Ambulatory Visit: Payer: Self-pay | Admitting: Obstetrics and Gynecology

## 2024-05-17 DIAGNOSIS — R928 Other abnormal and inconclusive findings on diagnostic imaging of breast: Secondary | ICD-10-CM
